# Patient Record
Sex: Male | Born: 1965 | Race: White | Hispanic: No | Marital: Married | Smoking: Never smoker
Health system: Southern US, Community
[De-identification: ages and names within clinical notes are randomized; demographics above are authoritative.]

## PROBLEM LIST (undated history)

## (undated) DIAGNOSIS — M549 Dorsalgia, unspecified: Secondary | ICD-10-CM

---

## 2017-03-15 ENCOUNTER — Emergency Department (HOSPITAL_COMMUNITY)
Admission: EM | Admit: 2017-03-15 | Discharge: 2017-03-15 | Disposition: A | Discharge status: Home / Self Care | Payer: BLUE CROSS/BLUE SHIELD | Attending: Emergency Medicine | Admitting: Emergency Medicine

## 2017-03-15 ENCOUNTER — Encounter (HOSPITAL_COMMUNITY): Payer: Self-pay

## 2017-03-15 DIAGNOSIS — M5431 Sciatica, right side: Secondary | ICD-10-CM | POA: Diagnosis not present

## 2017-03-15 DIAGNOSIS — M5432 Sciatica, left side: Secondary | ICD-10-CM | POA: Insufficient documentation

## 2017-03-15 DIAGNOSIS — M5430 Sciatica, unspecified side: Secondary | ICD-10-CM

## 2017-03-15 DIAGNOSIS — M549 Dorsalgia, unspecified: Secondary | ICD-10-CM | POA: Diagnosis present

## 2017-03-15 HISTORY — DX: Dorsalgia, unspecified: M54.9

## 2017-03-15 MED ORDER — LORAZEPAM 2 MG/ML IJ SOLN
1.0000 mg | Freq: Once | INTRAMUSCULAR | Status: AC
  Administered 2017-03-15: 1 mg via INTRAVENOUS
  Filled 2017-03-15: qty 1

## 2017-03-15 MED ORDER — HYDROCODONE-ACETAMINOPHEN 5-325 MG PO TABS: 1.0000 | ORAL_TABLET | ORAL | 0 refills | Status: DC | PRN

## 2017-03-15 MED ORDER — HYDROMORPHONE HCL 1 MG/ML IJ SOLN
1.0000 mg | Freq: Once | INTRAMUSCULAR | Status: AC
  Administered 2017-03-15: 1 mg via INTRAVENOUS
  Filled 2017-03-15: qty 1

## 2017-03-15 MED ORDER — MORPHINE SULFATE (PF) 4 MG/ML IV SOLN
4.0000 mg | Freq: Once | INTRAVENOUS | Status: AC
  Administered 2017-03-15: 4 mg via INTRAVENOUS
  Filled 2017-03-15: qty 1

## 2017-03-15 MED ORDER — IBUPROFEN 600 MG PO TABS: 600.0000 mg | ORAL_TABLET | Freq: Four times a day (QID) | ORAL | 0 refills | Status: AC | PRN

## 2017-03-15 MED ORDER — DIAZEPAM 5 MG PO TABS: 5.0000 mg | ORAL_TABLET | Freq: Two times a day (BID) | ORAL | 0 refills | Status: DC

## 2017-03-15 MED ORDER — DEXAMETHASONE SODIUM PHOSPHATE 10 MG/ML IJ SOLN
10.0000 mg | Freq: Once | INTRAMUSCULAR | Status: AC
  Administered 2017-03-15: 10 mg via INTRAVENOUS
  Filled 2017-03-15: qty 1

## 2017-03-15 MED ORDER — PREDNISONE 10 MG (21) PO TBPK
ORAL_TABLET | ORAL | 0 refills | Status: AC
Start: 2017-03-15 — End: ?

## 2017-03-15 MED ORDER — ONDANSETRON HCL 4 MG/2ML IJ SOLN
4.0000 mg | Freq: Once | INTRAMUSCULAR | Status: AC
Start: 2017-03-15 — End: 2017-03-15
  Administered 2017-03-15: 4 mg via INTRAVENOUS
  Filled 2017-03-15: qty 2

## 2017-03-15 NOTE — ED Triage Notes (Signed)
Pt complains of chronic sciatic type back pain, the last two day his pain has become worse and no relief from meds or exercises Pt states it's hard to sit

## 2017-03-15 NOTE — ED Provider Notes (Signed)
[facility]-EMERGENCY DEPT Provider Note   CSN: [id number] Arrival date & time: 03/15/17  2115     History   Chief Complaint Chief Complaint  Patient presents with  . Back Pain    HPI Erik Wood is a 51 y.o. male.  Pt presents to the ED today with back pain and pain going down both legs.  He has a hx of sciatica.  First time was 2 years ago.  He was given exercises which relieved his sx.  He has not had any trouble until a few days ago.  The pt said he's been doing his exercises, but the pain is getting worse and worse.  The pt said he can't get comfortable from the pain.  The pt denies any difficulty urinating.  He is able to ambulate.      Past Medical History:  Diagnosis Date  . Back pain     There are no active problems to display for this patient.   History reviewed. No pertinent surgical history.     Home Medications    Prior to Admission medications   Medication Sig Start Date End Date Taking? Authorizing Provider  acetaminophen (TYLENOL) 500 MG tablet Take 1,000 mg by mouth every 6 (six) hours as needed for moderate pain.   Yes [provider]  diazepam (VALIUM) 5 MG tablet Take 1 tablet (5 mg total) by mouth 2 (two) times daily. 03/15/17   Jacalyn Lefevre, MD  HYDROcodone-acetaminophen (NORCO/VICODIN) 5-325 MG tablet Take 1 tablet by mouth every 4 (four) hours as needed. 03/15/17   Jacalyn Lefevre, MD  ibuprofen (ADVIL,MOTRIN) 600 MG tablet Take 1 tablet (600 mg total) by mouth every 6 (six) hours as needed. 03/15/17   Jacalyn Lefevre, MD  predniSONE (STERAPRED UNI-PAK 21 TAB) 10 MG (21) TBPK tablet Take 6 tabs by mouth daily  for 2 days, then 5 tabs for 2 days, then 4 tabs for 2 days, then 3 tabs for 2 days, 2 tabs for 2 days, then 1 tab by mouth daily for 2 days 03/15/17   Jacalyn Lefevre, MD    Family History History reviewed. No pertinent family history.  Social History Social History  Substance Use Topics  . Smoking status: Never Smoker  .  Smokeless tobacco: Never Used  . Alcohol use No     Allergies   Mango flavor [flavoring agent]   Review of Systems Review of Systems  Musculoskeletal: Positive for back pain.  All other systems reviewed and are negative.    Physical Exam Updated Vital Signs BP 133/85 (BP Location: Left Arm)   Pulse 80   Resp 14   SpO2 96%   Physical Exam  Constitutional: He is oriented to person, place, and time. He appears well-developed and well-nourished.  HENT:  Head: Normocephalic and atraumatic.  Right Ear: External ear normal.  Left Ear: External ear normal.  Nose: Nose normal.  Mouth/Throat: Oropharynx is clear and moist.  Eyes: Pupils are equal, round, and reactive to light. Conjunctivae and EOM are normal.  Neck: Normal range of motion. Neck supple.  Cardiovascular: Normal rate, regular rhythm, normal heart sounds and intact distal pulses.   Pulmonary/Chest: Effort normal and breath sounds normal.  Abdominal: Soft. Bowel sounds are normal.  Musculoskeletal: Normal range of motion.  Neurological: He is alert and oriented to person, place, and time.  Skin: Skin is warm. Capillary refill takes less than 2 seconds.  Psychiatric: He has a normal mood and affect. His behavior is normal. Judgment and thought  content normal.  Nursing note and vitals reviewed.    ED Treatments / Results  Labs (all labs ordered are listed, but only abnormal results are displayed) Labs Reviewed - No data to display  EKG  EKG Interpretation None       Radiology No results found.  Procedures Procedures (including critical care time)  Medications Ordered in ED Medications  morphine 4 MG/ML injection 4 mg (4 mg Intravenous Given 03/15/17 2213)  ondansetron (ZOFRAN) injection 4 mg (4 mg Intravenous Given 03/15/17 2219)  dexamethasone (DECADRON) injection 10 mg (10 mg Intravenous Given 03/15/17 2219)  LORazepam (ATIVAN) injection 1 mg (1 mg Intravenous Given 03/15/17 2221)  HYDROmorphone  (DILAUDID) injection 1 mg (1 mg Intravenous Given 03/15/17 2252)  HYDROmorphone (DILAUDID) injection 1 mg (1 mg Intravenous Given 03/15/17 2330)     Initial Impression / Assessment and Plan / ED Course  I have reviewed the triage vital signs and the nursing notes.  Pertinent labs & imaging results that were available during my care of the patient were reviewed by me and considered in my medical decision making (see chart for details).     Pt is finally feeling better.  He is able to move without exquisite pain.  Pt is stable for d/c.  He is instructed to continue with his exercises.  F/u with ortho.  Return if worse.  Final Clinical Impressions(s) / ED Diagnoses   Final diagnoses:  Acute sciatica    New Prescriptions New Prescriptions   DIAZEPAM (VALIUM) 5 MG TABLET    Take 1 tablet (5 mg total) by mouth 2 (two) times daily.   HYDROCODONE-ACETAMINOPHEN (NORCO/VICODIN) 5-325 MG TABLET    Take 1 tablet by mouth every 4 (four) hours as needed.   IBUPROFEN (ADVIL,MOTRIN) 600 MG TABLET    Take 1 tablet (600 mg total) by mouth every 6 (six) hours as needed.   PREDNISONE (STERAPRED UNI-PAK 21 TAB) 10 MG (21) TBPK TABLET    Take 6 tabs by mouth daily  for 2 days, then 5 tabs for 2 days, then 4 tabs for 2 days, then 3 tabs for 2 days, 2 tabs for 2 days, then 1 tab by mouth daily for 2 days     Jacalyn Lefevre, MD 03/15/17 2330

## 2017-03-19 ENCOUNTER — Ambulatory Visit (INDEPENDENT_AMBULATORY_CARE_PROVIDER_SITE_OTHER): Payer: BLUE CROSS/BLUE SHIELD | Admitting: Physician Assistant

## 2017-03-19 ENCOUNTER — Ambulatory Visit (INDEPENDENT_AMBULATORY_CARE_PROVIDER_SITE_OTHER): Payer: BLUE CROSS/BLUE SHIELD

## 2017-03-19 DIAGNOSIS — M545 Low back pain: Secondary | ICD-10-CM | POA: Diagnosis not present

## 2017-03-19 MED ORDER — DIAZEPAM 5 MG PO TABS: 5.0000 mg | ORAL_TABLET | Freq: Two times a day (BID) | ORAL | 0 refills | Status: AC

## 2017-03-19 MED ORDER — HYDROCODONE-ACETAMINOPHEN 5-325 MG PO TABS: 1.0000 | ORAL_TABLET | ORAL | 0 refills | Status: AC | PRN

## 2017-03-19 NOTE — Progress Notes (Signed)
Office Visit Note   Patient: Erik Wood          Date of Birth: Mar 13, 1966           MRN: [id number] Visit Date: 03/19/2017              Requested by: No referring provider defined for this encounter. PCP: Patient, No Pcp Per   Assessment & Plan: Visit Diagnoses:  1. Low back pain, unspecified back pain laterality, unspecified chronicity, with sciatica presence unspecified     Plan: We'll send him to physical therapy for stretching home exercise program and modalities. He'll follow with [place] in 1 moat would most likely be a MRI due to the chronic nature  of his pain and  radicular symptoms down both legs..did discuss with him stopping ibuprofen while he is on a Medrol Dosepak and he can return to taking the ibuprofen.  Follow-Up Instructions: Return in about 4 weeks (around 04/16/2017).   Orders:  Orders Placed This Encounter  Procedures  . XR Lumbar Spine Complete   Meds ordered this encounter  Medications  . diazepam (VALIUM) 5 MG tablet    Sig: Take 1 tablet (5 mg total) by mouth 2 (two) times daily.    Dispense:  20 tablet    Refill:  0  . HYDROcodone-acetaminophen (NORCO/VICODIN) 5-325 MG tablet    Sig: Take 1 tablet by mouth every 4 (four) hours as needed.    Dispense:  20 tablet    Refill:  0      Procedures: No procedures performed   Clinical Data: No additional findings.   Subjective: Chief Complaint  Patient presents with  . Hospitalization Follow-up    Back Pain seen in E.R. 03/15/17    HPI [name] is a 51 year old male comes in today with low back pain with radicular symptoms down both legs. Seen in the ER last Thursday due to the back pain. He is given morphine injection. He was sent home with a Medrol Dosepak diazepam ibuprofen. He continues to have low back pain. Has some numbness tingling down the right leg. Most of his pain and numbness tingling stem lateral aspects of the legs. He's had on and off back pain for years she usually  responds well to stretching exercises. He's having no awaking pain.  Review of Systems Denies any bowel bladder dysfunction. Positive for low back pain with radicular symptoms down both legs. Otherwise negative.  Objective: Vital Signs: There were no vitals taken for this visit.  Physical Exam  Constitutional: He is oriented to person, place, and time. He appears well-developed and well-nourished. No distress.  Cardiovascular: Intact distal pulses.   Pulmonary/Chest: Effort normal.  Neurological: He is alert and oriented to person, place, and time.  Skin: He is not diaphoretic.  Psychiatric: He has a normal mood and affect.    Ortho Exam Negative straight leg raise on the left positive on the right. Is able to touch hiss. He has limited extension No tenderness over the paraspinous region and over the lumbar spinal column. 5 out of 5 strength throughout the lower extremities. Specialty Comments:  No specialty comments available.  Imaging: Xr Lumbar Spine Complete  Result Date: 03/19/2017 Lumbar spine AP lateral and oblique views: No acute fracture. Disc space are well maintained. No spinal stenosis. Hips are well maintained on the AP view.    PMFS History: There are no active problems to display for this patient.  Past Medical History:  Diagnosis Date  .  Back pain     No family history on file.  No past surgical history on file. Social History   Occupational History  . Not on file.   Social History Main Topics  . Smoking status: Never Smoker  . Smokeless tobacco: Never Used  . Alcohol use No  . Drug use: No  . Sexual activity: Not on file

## 2017-04-16 ENCOUNTER — Ambulatory Visit (INDEPENDENT_AMBULATORY_CARE_PROVIDER_SITE_OTHER): Payer: BLUE CROSS/BLUE SHIELD | Admitting: Orthopaedic Surgery

## 2019-09-25 ENCOUNTER — Encounter (HOSPITAL_COMMUNITY): Payer: Self-pay | Admitting: Emergency Medicine

## 2019-09-25 ENCOUNTER — Other Ambulatory Visit: Payer: Self-pay

## 2019-09-25 ENCOUNTER — Emergency Department (HOSPITAL_COMMUNITY)
Admission: EM | Admit: 2019-09-25 | Discharge: 2019-09-26 | Disposition: A | Discharge status: Home / Self Care | Payer: 59 | Attending: Emergency Medicine | Admitting: Emergency Medicine

## 2019-09-25 DIAGNOSIS — R6884 Jaw pain: Secondary | ICD-10-CM | POA: Insufficient documentation

## 2019-09-25 NOTE — ED Triage Notes (Signed)
Patient complaining of left jaw pain. Patient states he went to the dentist on Tuesday but he is still having jaw pain.

## 2019-09-26 MED ORDER — METHOCARBAMOL 500 MG PO TABS: 500.0000 mg | ORAL_TABLET | Freq: Two times a day (BID) | ORAL | 0 refills | Status: AC

## 2019-09-26 MED ORDER — METHOCARBAMOL 500 MG PO TABS
500.0000 mg | ORAL_TABLET | Freq: Once | ORAL | Status: AC
  Administered 2019-09-26: 01:00:00 500 mg via ORAL
  Filled 2019-09-26: qty 1

## 2019-09-26 NOTE — ED Provider Notes (Signed)
[facility]-EMERGENCY DEPT Provider Note   CSN: [id number] Arrival date & time: 09/25/19  2216     History Chief Complaint  Patient presents with  . Jaw Pain    Erik Wood is a 54 y.o. male.  The history is provided by the patient and medical records.    54 year old male here with left-sided jaw pain.  States he has been having pain since last weekend, approximately 5 days now.  He went to the dentist yesterday and had a filling replaced in one of his left lower molars.  He did have x-rays performed without any acute findings.  States pain persists, throbbing in nature along his left lower jaw but radiates into and around left ear.  Does have increased pain with opening and closing his mouth.  He does not grind his teeth.  No fever or chills.  No difficulty swallowing.  He has not tried any medications at home.  Past Medical History:  Diagnosis Date  . Back pain     There are no problems to display for this patient.   History reviewed. No pertinent surgical history.     History reviewed. No pertinent family history.  Social History   Tobacco Use  . Smoking status: Never Smoker  . Smokeless tobacco: Never Used  Substance Use Topics  . Alcohol use: No  . Drug use: No    Home Medications Prior to Admission medications   Medication Sig Start Date End Date Taking? Authorizing Provider  diazepam (VALIUM) 5 MG tablet Take 1 tablet (5 mg total) by mouth 2 (two) times daily. 03/19/17   Kirtland Bouchard, PA-C  HYDROcodone-acetaminophen (NORCO/VICODIN) 5-325 MG tablet Take 1 tablet by mouth every 4 (four) hours as needed. 03/19/17   Kirtland Bouchard, PA-C  ibuprofen (ADVIL,MOTRIN) 600 MG tablet Take 1 tablet (600 mg total) by mouth every 6 (six) hours as needed. 03/15/17   Jacalyn Lefevre, MD  predniSONE (DELTASONE) 10 MG tablet TAKE 6 TABS FOR 2 DAYS, 5 TABS FOR 2 DAYS, 4 TABS FOR 2 DAYS, 3 TABS FOR 2 DAYS,1 TAB FOR 2 DAYS 03/16/17   [provider]  predniSONE (STERAPRED UNI-PAK 21 TAB) 10 MG (21) TBPK tablet Take 6 tabs by mouth daily  for 2 days, then 5 tabs for 2 days, then 4 tabs for 2 days, then 3 tabs for 2 days, 2 tabs for 2 days, then 1 tab by mouth daily for 2 days 03/15/17   Jacalyn Lefevre, MD    Allergies    Mango flavor [flavoring agent]  Review of Systems   Review of Systems  HENT: Positive for dental problem.   All other systems reviewed and are negative.   Physical Exam Updated Vital Signs BP (!) 162/113 (BP Location: Left Arm)   Pulse 73   Temp 98.1 F (36.7 C) (Oral)   Resp 18   Ht 6\' 1"  (1.854 m)   Wt 102.1 kg   SpO2 99%   BMI 29.69 kg/m   Physical Exam Vitals and nursing note reviewed.  Constitutional:      Appearance: He is well-developed.  HENT:     Head: Normocephalic and atraumatic.     Ears:     Comments: Left EAC and TM normal in appearance    Mouth/Throat:     Comments: Teeth largely in good dentition, left lower 2nd molar has new filling in place, surrounding gingiva normal in appearance, no swelling, tenderness, or abscess formation noted, handling secretions appropriately,  no trismus, no facial or neck swelling, normal phonation without stridor Does have some tenderness along left TMJ, popping elicited with opening/closing mouth, no malocclusion present Eyes:     Conjunctiva/sclera: Conjunctivae normal.     Pupils: Pupils are equal, round, and reactive to light.  Cardiovascular:     Rate and Rhythm: Normal rate and regular rhythm.     Heart sounds: Normal heart sounds.  Pulmonary:     Effort: Pulmonary effort is normal.     Breath sounds: Normal breath sounds.  Abdominal:     General: Bowel sounds are normal.     Palpations: Abdomen is soft.  Musculoskeletal:        General: Normal range of motion.     Cervical back: Normal range of motion.  Skin:    General: Skin is warm and dry.  Neurological:     Mental Status: He is alert and oriented to person, place, and  time.     ED Results / Procedures / Treatments   Labs (all labs ordered are listed, but only abnormal results are displayed) Labs Reviewed - No data to display  EKG None  Radiology No results found.  Procedures Procedures (including critical care time)  Medications Ordered in ED Medications  methocarbamol (ROBAXIN) tablet 500 mg (500 mg Oral Given 09/26/19 0122)    ED Course  I have reviewed the triage vital signs and the nursing notes.  Pertinent labs & imaging results that were available during my care of the patient were reviewed by me and considered in my medical decision making (see chart for details).    MDM Rules/Calculators/A&P  54 year old male here with left lower jaw pain.  Seen by dentist yesterday and had revision of feeling of his left lower second molar.  He reports ongoing pain.  He is afebrile and nontoxic in appearance here.  He does not have any facial or neck swelling.  His new filling remains intact, surrounding gingiva normal in appearance without signs of irritation or infection.  No evidence of abscess formation.  He does seem to have some tenderness along the left TMJ, popping noted when opening and closing mouth.  No trismus or malocclusion.  Pain is radiating into the left ear, left EAC and TM normal in appearance.  May be related to TMJ.  Will start on course of muscle relaxers to see if this can help, also encouraged Tylenol/Motrin along with warm compress.  He will need to arrange follow-up with his dentist.  He may return here for any new or acute changes.  Final Clinical Impression(s) / ED Diagnoses Final diagnoses:  Jaw pain    Rx / DC Orders ED Discharge Orders         Ordered    methocarbamol (ROBAXIN) 500 MG tablet  2 times daily     09/26/19 0107           Larene Pickett, PA-C 09/26/19 0143    Molpus, Jenny Reichmann, MD 09/26/19 [phone]

## 2019-09-26 NOTE — Discharge Instructions (Signed)
Take the prescribed medication as directed.  Can also use tylenol, motrin, and warm compress to see if this helps. Follow-up with your dentist-- I would call them this morning and notify them of ongoing pain. Return to the ED for new or worsening symptoms.

## 2021-08-16 ENCOUNTER — Other Ambulatory Visit: Payer: Self-pay | Admitting: Family Medicine

## 2021-08-16 ENCOUNTER — Ambulatory Visit
Admission: RE | Admit: 2021-08-16 | Discharge: 2021-08-16 | Disposition: A | Discharge status: Home / Self Care | Payer: No Typology Code available for payment source | Source: Ambulatory Visit | Attending: Family Medicine | Admitting: Family Medicine

## 2021-08-16 DIAGNOSIS — M79672 Pain in left foot: Secondary | ICD-10-CM

## 2021-08-16 DIAGNOSIS — M7989 Other specified soft tissue disorders: Secondary | ICD-10-CM

## 2021-09-13 ENCOUNTER — Ambulatory Visit: Payer: No Typology Code available for payment source | Admitting: Podiatry

## 2021-09-13 ENCOUNTER — Ambulatory Visit: Payer: No Typology Code available for payment source

## 2021-09-13 ENCOUNTER — Ambulatory Visit (INDEPENDENT_AMBULATORY_CARE_PROVIDER_SITE_OTHER): Payer: No Typology Code available for payment source

## 2021-09-13 DIAGNOSIS — M84375A Stress fracture, left foot, initial encounter for fracture: Secondary | ICD-10-CM

## 2021-09-13 DIAGNOSIS — M778 Other enthesopathies, not elsewhere classified: Secondary | ICD-10-CM | POA: Diagnosis not present

## 2021-09-13 DIAGNOSIS — S99922A Unspecified injury of left foot, initial encounter: Secondary | ICD-10-CM | POA: Diagnosis not present

## 2021-09-13 DIAGNOSIS — E781 Pure hyperglyceridemia: Secondary | ICD-10-CM | POA: Insufficient documentation

## 2021-09-13 MED ORDER — IBUPROFEN 800 MG PO TABS: 800.0000 mg | ORAL_TABLET | Freq: Three times a day (TID) | ORAL | 0 refills | Status: AC | PRN

## 2021-09-13 NOTE — Progress Notes (Signed)
SITUATION ?Reason for Consult: Evaluation for Bilateral Custom Foot Orthoses ?Patient / Caregiver Report: Patient is ready for foot orthotics ? ?OBJECTIVE DATA: ?Patient History / Diagnosis:  ?  ICD-10-CM   ?1. Capsulitis of left foot  M77.8   ?  ? ? ?Current or Previous Devices:   None and no history ? ?Foot Examination: ?Skin presentation:   Intact ?Ulcers & Callousing:   None ?Toe / Foot Deformities:  Hammertoes ?Weight Bearing Presentation:  Rectus ?Sensation:    Intact ? ?Shoe Size:    11.28M ? ?ORTHOTIC RECOMMENDATION ?Recommended Device: 1x pair of custom functional foot orthotics ? ?GOALS OF ORTHOSES ?- Reduce Pain ?- Prevent Foot Deformity ?- Prevent Progression of Further Foot Deformity ?- Relieve Pressure ?- Improve the Overall Biomechanical Function of the Foot and Lower Extremity. ? ?ACTIONS PERFORMED ?Potential out of pocket cost was communicated to patient. Patient understood and consent to casting. Patient was casted for Foot Orthoses via crush box. Procedure was explained and patient tolerated procedure well. Casts were shipped to central fabrication. All questions were answered and concerns addressed. ? ?PLAN ?Patient is to be called for fitting when devices are ready.  ? ? ?

## 2021-09-13 NOTE — Progress Notes (Signed)
?Subjective:  ? ?Patient ID: Flavia Shipper, male   DOB: 56 y.o.   MRN: [id number]  ? ?HPI ?56 year old male presents the office today for concerns of pain to his left foot.  He gets discomfort the top of the foot and he also feels a pebble in the ball of his foot at times.  He states that pain started after increased walking.  At first he thought his primary care doctor thought it was gout.  He states he was sent for gout, infection as well as diabetes which were all negative.  He has tried wrapping the first and second toes which does help some.  Recently took a round of prednisone which helped for first couple of days was prescribed by his primary care doctor.  No injuries that he reports.  No other concerns. ? ? ?Review of Systems  ?All other systems reviewed and are negative. ? ?Past Medical History:  ?Diagnosis Date  ? Back pain   ? ? ?No past surgical history on file. ? ? ?Current Outpatient Medications:  ?  ibuprofen (ADVIL) 800 MG tablet, Take 1 tablet (800 mg total) by mouth every 8 (eight) hours as needed., Disp: 30 tablet, Rfl: 0 ?  diazepam (VALIUM) 5 MG tablet, Take 1 tablet (5 mg total) by mouth 2 (two) times daily., Disp: 20 tablet, Rfl: 0 ?  HYDROcodone-acetaminophen (NORCO/VICODIN) 5-325 MG tablet, Take 1 tablet by mouth every 4 (four) hours as needed., Disp: 20 tablet, Rfl: 0 ?  ibuprofen (ADVIL,MOTRIN) 600 MG tablet, Take 1 tablet (600 mg total) by mouth every 6 (six) hours as needed., Disp: 30 tablet, Rfl: 0 ?  methocarbamol (ROBAXIN) 500 MG tablet, Take 1 tablet (500 mg total) by mouth 2 (two) times daily., Disp: 20 tablet, Rfl: 0 ?  predniSONE (DELTASONE) 10 MG tablet, TAKE 6 TABS FOR 2 DAYS, 5 TABS FOR 2 DAYS, 4 TABS FOR 2 DAYS, 3 TABS FOR 2 DAYS,1 TAB FOR 2 DAYS, Disp: , Rfl: 0 ?  predniSONE (STERAPRED UNI-PAK 21 TAB) 10 MG (21) TBPK tablet, Take 6 tabs by mouth daily  for 2 days, then 5 tabs for 2 days, then 4 tabs for 2 days, then 3 tabs for 2 days, 2 tabs for 2 days, then 1 tab by  mouth daily for 2 days, Disp: 42 tablet, Rfl: 0 ? ?Allergies  ?Allergen Reactions  ? Mango Flavor [Flavoring Agent] Other (See Comments)  ?  Mango Fruit, Acid Reflux  ? ? ? ? ? ?   ?Objective:  ?Physical Exam  ?General: AAO x3, NAD ? ?Dermatological: Skin is warm, dry and supple bilateral.  There are no open sores, no preulcerative lesions, no rash or signs of infection present. ? ?Vascular: Dorsalis Pedis artery and Posterior Tibial artery pedal pulses are 2/4 bilateral with immedate capillary fill time. There is no pain with calf compression, swelling, warmth, erythema.  ? ?Neruologic: Grossly intact via light touch bilateral. ? ?Musculoskeletal: Tenderness palpation on the second MPJ both dorsally and plantarly there is localized edema to this area.  There is mild pinpoint tenderness noted on the second metatarsal and there is mild discomfort to vibratory sensation to this area.  Tenderness on the plantar aspect of the second toe as well.  Muscular strength 5/5 in all groups tested bilateral. ? ?Gait: Unassisted, Nonantalgic.  ? ? ?   ?Assessment:  ? ?Capsulitis second MPJ, concern for stress fracture, plantar plate injury ? ?   ?Plan:  ?-Treatment options discussed including all alternatives, risks,  and complications ?-Etiology of symptoms were discussed ?-X-rays were obtained and reviewed with the patient.  3 views of the left foot were obtained.  No evidence of acute fracture or stress fracture.  Elongated second metatarsal is noted. ?-Short-term given his pain and swelling recommend mobilization with surgical shoe which was dispensed today.  Ibuprofen 800 mg as needed.  Ice, elevation and recommended off on any walking or exercise that involves the foot. ?-Long-term likely benefit from inserts to help offload the second digit.  He was seen today by Arlys John our orthotist for this. ? ?Vivi Barrack DPM ? ? ? ?   ? ?

## 2021-09-20 ENCOUNTER — Other Ambulatory Visit: Payer: Self-pay

## 2021-09-20 ENCOUNTER — Emergency Department (HOSPITAL_COMMUNITY)
Admission: EM | Admit: 2021-09-20 | Discharge: 2021-09-20 | Disposition: A | Discharge status: Home / Self Care | Payer: No Typology Code available for payment source | Attending: Emergency Medicine | Admitting: Emergency Medicine

## 2021-09-20 ENCOUNTER — Other Ambulatory Visit: Payer: Self-pay | Admitting: Podiatry

## 2021-09-20 ENCOUNTER — Encounter: Payer: Self-pay | Admitting: Podiatry

## 2021-09-20 ENCOUNTER — Encounter (HOSPITAL_COMMUNITY): Payer: Self-pay | Admitting: Emergency Medicine

## 2021-09-20 DIAGNOSIS — R600 Localized edema: Secondary | ICD-10-CM | POA: Insufficient documentation

## 2021-09-20 DIAGNOSIS — M7989 Other specified soft tissue disorders: Secondary | ICD-10-CM | POA: Diagnosis present

## 2021-09-20 LAB — CBC
HCT: 42.8 % (ref 39.0–52.0)
Hemoglobin: 14.3 g/dL (ref 13.0–17.0)
MCH: 29.4 pg (ref 26.0–34.0)
MCHC: 33.4 g/dL (ref 30.0–36.0)
MCV: 88.1 fL (ref 80.0–100.0)
Platelets: 269 10*3/uL (ref 150–400)
RBC: 4.86 MIL/uL (ref 4.22–5.81)
RDW: 11.9 % (ref 11.5–15.5)
WBC: 7.2 10*3/uL (ref 4.0–10.5)
nRBC: 0 % (ref 0.0–0.2)

## 2021-09-20 LAB — BASIC METABOLIC PANEL
Anion gap: 6 (ref 5–15)
BUN: 12 mg/dL (ref 6–20)
CO2: 26 mmol/L (ref 22–32)
Calcium: 9.2 mg/dL (ref 8.9–10.3)
Chloride: 105 mmol/L (ref 98–111)
Creatinine, Ser: 0.85 mg/dL (ref 0.61–1.24)
GFR, Estimated: >60 mL/min (ref >60)
Glucose, Bld: 110 mg/dL — ABNORMAL HIGH (ref 70–99)
Potassium: 4 mmol/L (ref 3.5–5.1)
Sodium: 137 mmol/L (ref 135–145)

## 2021-09-20 MED ORDER — ENOXAPARIN SODIUM 100 MG/ML IJ SOSY
100.0000 mg | PREFILLED_SYRINGE | INTRAMUSCULAR | Status: AC
  Administered 2021-09-20: 100 mg via SUBCUTANEOUS
  Filled 2021-09-20: qty 1

## 2021-09-20 NOTE — Telephone Encounter (Signed)
Please advise 

## 2021-09-20 NOTE — ED Provider Notes (Signed)
?[facility]-EMERGENCY DEPT ?Provider Note ? ? ?CSN: [id number] ?Arrival date & time: 09/20/21  1912 ? ?  ? ?History ? ?Chief Complaint  ?Patient presents with  ? Leg Swelling  ?  Right leg  ? ? ?Erik Wood is a 56 y.o. male with no documented medical history.  The patient presents to the ED for evaluation of right leg swelling.  Patient states that he recently broke his left foot, has been more bedbound than usual recently.  Patient states that he noticed that his right leg was swelling yesterday and it continued into today.  Patient states that he got in touch with his podiatrist who advised him to report to ED for ultrasound imaging.  Patient worsening right leg swelling.  Patient denies any fevers, nausea, vomiting, diarrhea, chest pain, shortness of breath. ? ?HPI ? ?  ? ?Home Medications ?Prior to Admission medications   ?Medication Sig Start Date End Date Taking? Authorizing Provider  ?diazepam (VALIUM) 5 MG tablet Take 1 tablet (5 mg total) by mouth 2 (two) times daily. 03/19/17   Kirtland Bouchard, PA-C  ?HYDROcodone-acetaminophen (NORCO/VICODIN) 5-325 MG tablet Take 1 tablet by mouth every 4 (four) hours as needed. 03/19/17   Kirtland Bouchard, PA-C  ?ibuprofen (ADVIL) 800 MG tablet Take 1 tablet (800 mg total) by mouth every 8 (eight) hours as needed. 09/13/21   Vivi Barrack, DPM  ?ibuprofen (ADVIL,MOTRIN) 600 MG tablet Take 1 tablet (600 mg total) by mouth every 6 (six) hours as needed. 03/15/17   Jacalyn Lefevre, MD  ?methocarbamol (ROBAXIN) 500 MG tablet Take 1 tablet (500 mg total) by mouth 2 (two) times daily. 09/26/19   Garlon Hatchet, PA-C  ?predniSONE (DELTASONE) 10 MG tablet TAKE 6 TABS FOR 2 DAYS, 5 TABS FOR 2 DAYS, 4 TABS FOR 2 DAYS, 3 TABS FOR 2 DAYS,1 TAB FOR 2 DAYS 03/16/17   [provider]  ?predniSONE (STERAPRED UNI-PAK 21 TAB) 10 MG (21) TBPK tablet Take 6 tabs by mouth daily  for 2 days, then 5 tabs for 2 days, then 4 tabs for 2 days, then 3 tabs for  2 days, 2 tabs for 2 days, then 1 tab by mouth daily for 2 days 03/15/17   Jacalyn Lefevre, MD  ?   ? ?Allergies    ?Mango flavor [flavoring agent]   ? ?Review of Systems   ?Review of Systems  ?Cardiovascular:  Positive for leg swelling.  ?All other systems reviewed and are negative. ? ?Physical Exam ?Updated Vital Signs ?BP (!) 160/90   Pulse 80   Temp 98.5 ?F (36.9 ?C) (Oral)   Resp 16   Ht 6\' 1"  (1.854 m)   Wt 102 kg   SpO2 100%   BMI 29.67 kg/m?  ?Physical Exam ?Vitals and nursing note reviewed.  ?Constitutional:   ?   General: He is not in acute distress. ?   Appearance: Normal appearance. He is not ill-appearing, toxic-appearing or diaphoretic.  ?HENT:  ?   Head: Normocephalic and atraumatic.  ?   Nose: Nose normal. No congestion.  ?   Mouth/Throat:  ?   Mouth: Mucous membranes are moist.  ?   Pharynx: Oropharynx is clear.  ?Eyes:  ?   Extraocular Movements: Extraocular movements intact.  ?   Conjunctiva/sclera: Conjunctivae normal.  ?   Pupils: Pupils are equal, round, and reactive to light.  ?Cardiovascular:  ?   Rate and Rhythm: Normal rate and regular rhythm.  ?   Pulses:     ?  Dorsalis pedis pulses are 2+ on the right side and 2+ on the left side.  ?Pulmonary:  ?   Effort: Pulmonary effort is normal.  ?   Breath sounds: Normal breath sounds. No wheezing.  ?Abdominal:  ?   General: Abdomen is flat.  ?   Palpations: Abdomen is soft.  ?   Tenderness: There is no abdominal tenderness.  ?Musculoskeletal:  ?   Cervical back: Normal range of motion and neck supple. No tenderness.  ?   Right lower leg: Edema present.  ?Skin: ?   General: Skin is warm and dry.  ?   Capillary Refill: Capillary refill takes less than 2 seconds.  ?Neurological:  ?   Mental Status: He is alert and oriented to person, place, and time.  ? ? ?ED Results / Procedures / Treatments   ?Labs ?(all labs ordered are listed, but only abnormal results are displayed) ?Labs Reviewed  ?BASIC METABOLIC PANEL - Abnormal; Notable for the  following components:  ?    Result Value  ? Glucose, Bld 110 (*)   ? All other components within normal limits  ?CBC  ? ? ?EKG ?None ? ?Radiology ?No results found. ? ?Procedures ?Procedures  ? ? ?Medications Ordered in ED ?Medications  ?enoxaparin (LOVENOX) injection 100 mg (100 mg Subcutaneous Given 09/20/21 2108)  ? ? ?ED Course/ Medical Decision Making/ A&P ?  ?                        ?Medical Decision Making ?Amount and/or Complexity of Data Reviewed ?Labs: ordered. ? ?Risk ?Prescription drug management. ? ? ?29 male presents ED for evaluation of leg swelling.  Please see HPI for further details. ? ?On examination, the patient does not have any appreciable swelling to his left leg.  Patient has 2+ DP pulse to the right foot.  Patient capillary refill in right great toe less than 2 seconds. ? ?Patient was advised that since it is after 7 PM we do not have ultrasound tech here right now.  Patient had outpatient ultrasound study scheduled for tomorrow, was given instructions on how to get to the imaging suite of [facility].  Patient was also given shot of Lovenox subcutaneously, 1 mg/kg.  Patient given return precautions and he voiced understanding.  Patient was advised since he is on a blood thinner now he will be at increased risk of internal bleeding where he does suffer any trauma.  The patient voiced understanding with this.  Patient had all of his questions answered to satisfaction.  The patient stable for discharge at this time. ? ?Final Clinical Impression(s) / ED Diagnoses ?Final diagnoses:  ?Right leg swelling  ? ? ?Rx / DC Orders ?ED Discharge Orders   ? ?      Ordered  ?  US Venous Img Lower Unilateral Right (DVT)  Status:  Canceled       ? 09/20/21 2031  ?  VAS [place] LOWER EXTREMITY VENOUS (DVT)  Status:  Canceled       ? 09/20/21 2043  ?  LE VENOUS       ? 09/20/21 2043  ? ?  ?  ? ?  ? ? ?  ?Al Decant, PA-C ?09/20/21 2224 ? ?  ?Melene Plan, DO ?09/20/21 2225 ? ?

## 2021-09-20 NOTE — ED Triage Notes (Signed)
Pt reports swelling to his right leg which began yesterday. Pt has a plantar plate injury to his left second toe from February. Pt reports the swelling to his right thigh begins in his upper thigh and goes down his leg.  ?

## 2021-09-20 NOTE — Discharge Instructions (Addendum)
Please see attached instructions regarding ultrasound. Please return to [facility] tomorrow for DVT ultrasound study. Please report at [facility] and go to imaging services. You may tell the person in the lobby why you are here and they will direct you to the proper area.  ?If you have any trauma tonight such as head trauma, motor vehicle accidents, etc. Please return to ED for further evaluation. Being on a blood thinner puts you at higher risk for internal bleeding. ?

## 2021-09-21 ENCOUNTER — Ambulatory Visit (HOSPITAL_COMMUNITY)
Admission: RE | Admit: 2021-09-21 | Discharge: 2021-09-21 | Disposition: A | Discharge status: Home / Self Care | Payer: No Typology Code available for payment source | Source: Ambulatory Visit | Attending: Emergency Medicine | Admitting: Emergency Medicine

## 2021-09-21 DIAGNOSIS — M25561 Pain in right knee: Secondary | ICD-10-CM | POA: Diagnosis not present

## 2021-09-21 DIAGNOSIS — M7989 Other specified soft tissue disorders: Secondary | ICD-10-CM | POA: Diagnosis not present

## 2021-10-03 ENCOUNTER — Telehealth: Payer: Self-pay

## 2021-10-03 NOTE — Telephone Encounter (Signed)
Foot orthotics ready for fitting - called and left message for patient to schedule appointment ?

## 2021-10-04 ENCOUNTER — Ambulatory Visit (INDEPENDENT_AMBULATORY_CARE_PROVIDER_SITE_OTHER): Payer: No Typology Code available for payment source

## 2021-10-04 DIAGNOSIS — M778 Other enthesopathies, not elsewhere classified: Secondary | ICD-10-CM | POA: Diagnosis not present

## 2021-10-04 NOTE — Progress Notes (Signed)
SITUATION: ?Reason for Visit: Fitting and Delivery of Custom Fabricated Foot Orthoses ?Patient Report: Patient reports comfort and is satisfied with device. ? ?OBJECTIVE DATA: ?Patient History / Diagnosis:   ?  ICD-10-CM   ?1. Capsulitis of left foot  M77.8   ?  ? ? ?Provided Device:  Custom Functional Foot Orthotics ?    [employer]: [id number] ? ?GOAL OF ORTHOSIS ?- Improve gait ?- Decrease energy expenditure ?- Improve Balance ?- Provide Triplanar stability of foot complex ?- Facilitate motion ? ?ACTIONS PERFORMED ?Patient was fit with foot orthotics trimmed to shoe last. Patient tolerated fittign procedure.  ? ?Patient was provided with verbal and written instruction and demonstration regarding donning, doffing, wear, care, proper fit, function, purpose, cleaning, and use of the orthosis and in all related precautions and risks and benefits regarding the orthosis. ? ?Patient was also provided with verbal instruction regarding how to report any failures or malfunctions of the orthosis and necessary follow up care. Patient was also instructed to contact our office regarding any change in status that may affect the function of the orthosis. ? ?Patient demonstrated independence with proper donning, doffing, and fit and verbalized understanding of all instructions. ? ?PLAN: ?Patient is to follow up in one week or as necessary (PRN). All questions were answered and concerns addressed. Plan of care was discussed with and agreed upon by the patient. ? ?

## 2021-11-10 ENCOUNTER — Other Ambulatory Visit: Payer: Self-pay | Admitting: Orthopedic Surgery

## 2021-11-10 DIAGNOSIS — M79604 Pain in right leg: Secondary | ICD-10-CM

## 2021-11-16 ENCOUNTER — Inpatient Hospital Stay: Admission: RE | Admit: 2021-11-16 | Payer: No Typology Code available for payment source | Source: Ambulatory Visit

## 2022-06-27 DIAGNOSIS — M25562 Pain in left knee: Secondary | ICD-10-CM | POA: Diagnosis not present

## 2022-06-27 DIAGNOSIS — M25561 Pain in right knee: Secondary | ICD-10-CM | POA: Diagnosis not present

## 2022-06-28 ENCOUNTER — Other Ambulatory Visit: Payer: Self-pay | Admitting: Physician Assistant

## 2022-06-28 ENCOUNTER — Ambulatory Visit
Admission: RE | Admit: 2022-06-28 | Discharge: 2022-06-28 | Disposition: A | Discharge status: Home / Self Care | Payer: BC Managed Care – PPO | Source: Ambulatory Visit | Attending: Physician Assistant | Admitting: Physician Assistant

## 2022-06-28 DIAGNOSIS — M1711 Unilateral primary osteoarthritis, right knee: Secondary | ICD-10-CM | POA: Diagnosis not present

## 2022-06-28 DIAGNOSIS — M1712 Unilateral primary osteoarthritis, left knee: Secondary | ICD-10-CM | POA: Diagnosis not present

## 2022-06-28 DIAGNOSIS — M25561 Pain in right knee: Secondary | ICD-10-CM

## 2024-01-20 IMAGING — CR DG FOOT COMPLETE 3+V*L*
3 series · 3 of 3 positions shown · non-contrast
Comparison: None.

CLINICAL DATA: Swelling of left foot. Pain in left foot. Swelling
and pain across ball of foot and swelling and second toe. No known
injury.

EXAM:
LEFT FOOT - COMPLETE 3+ VIEW

[x foot ap left]
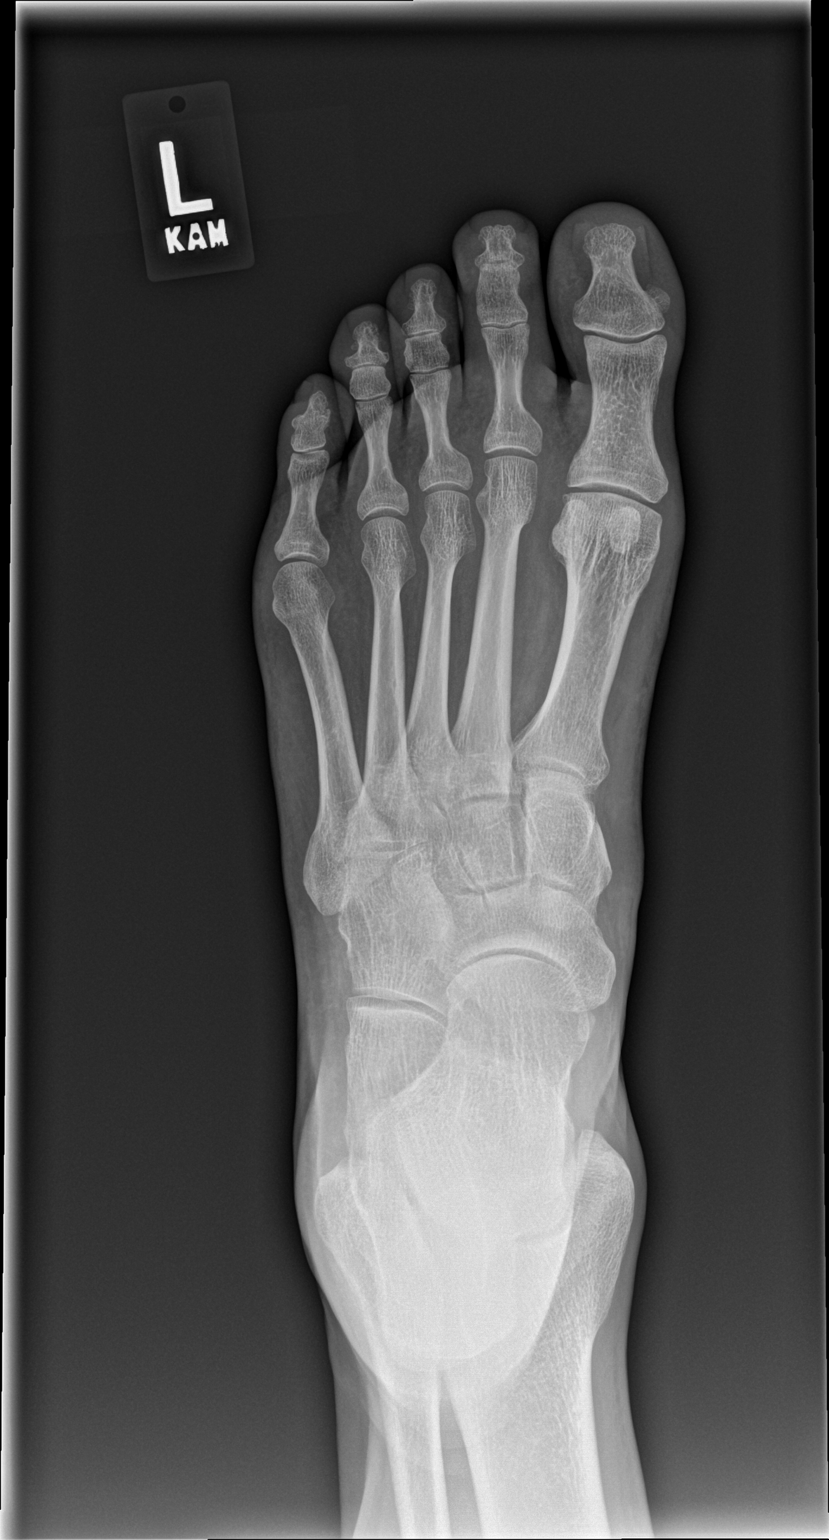

[x foot obl left]
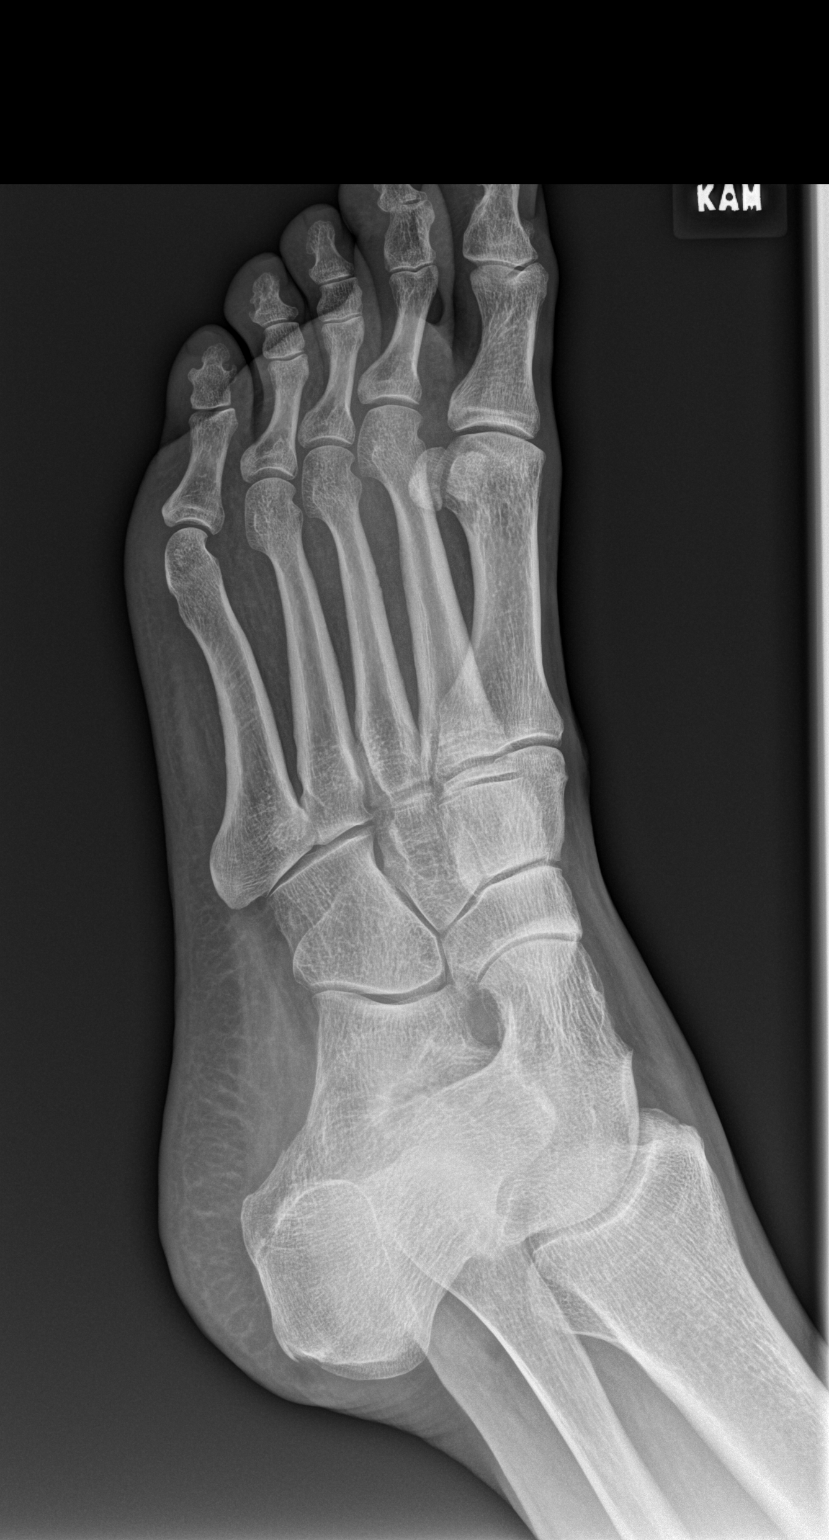

[x foot lat left]
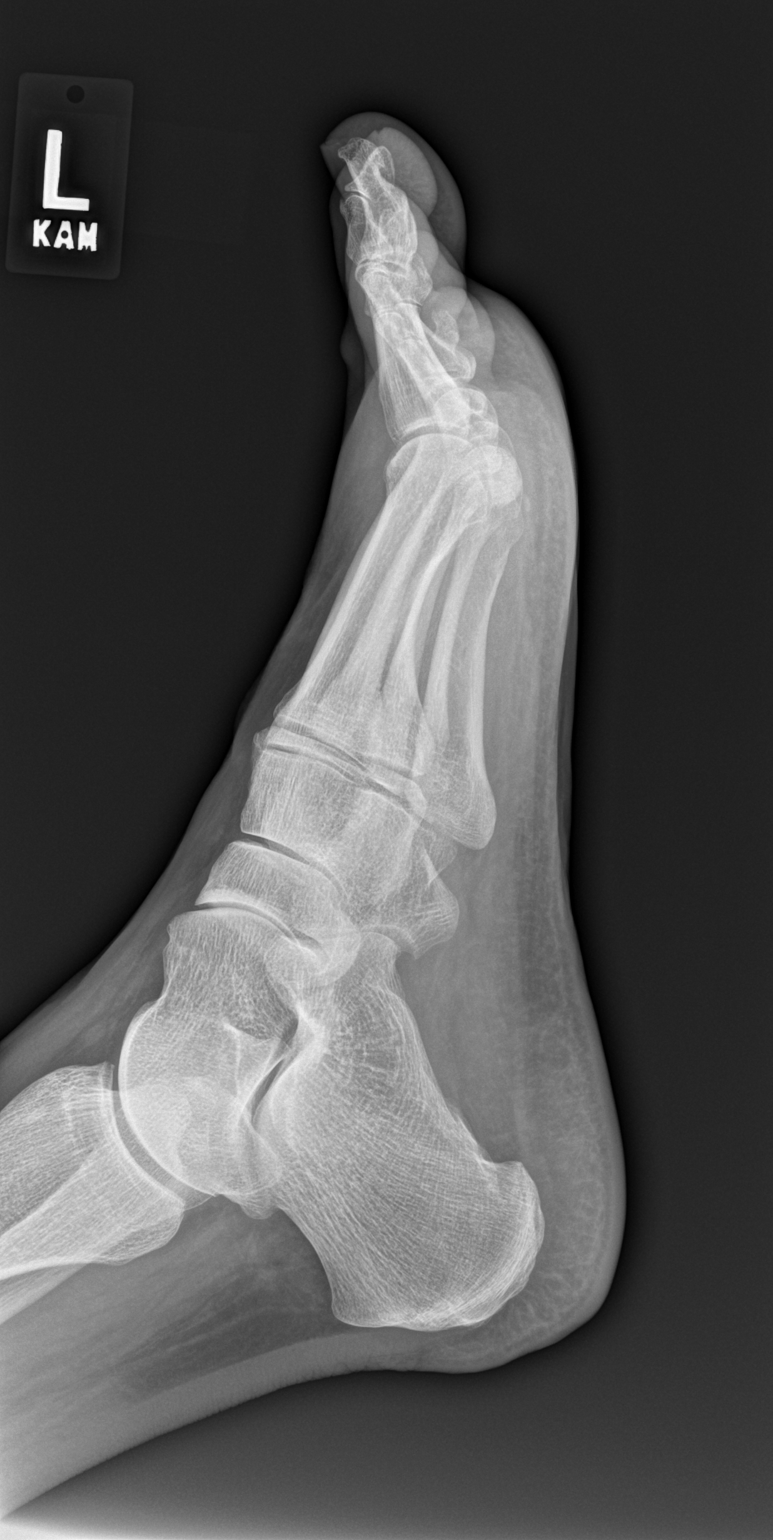

[3 of 3 positions shown; findings below may reference images not displayed]

FINDINGS: Minimal lateral great toe metatarsophalangeal joint space narrowing
and peripheral spurring. Minimal talonavicular joint space narrowing
and dorsal degenerative spurring.

No acute fracture or dislocation.
IMPRESSION: Minimal great toe metatarsophalangeal joint and talonavicular joint
osteoarthritis.
